# Patient Record
Sex: Male | Born: 1995 | Race: Black or African American | Hispanic: No | Marital: Single | Smoking: Current every day smoker
Health system: Southern US, Community
[De-identification: ages and names within clinical notes are randomized; demographics above are authoritative.]

## PROBLEM LIST (undated history)

## (undated) DIAGNOSIS — B20 Human immunodeficiency virus [HIV] disease: Secondary | ICD-10-CM

---

## 2017-05-21 ENCOUNTER — Emergency Department (HOSPITAL_COMMUNITY): Payer: Medicaid - Out of State

## 2017-05-21 ENCOUNTER — Emergency Department (HOSPITAL_COMMUNITY)
Admission: EM | Admit: 2017-05-21 | Discharge: 2017-05-21 | Disposition: A | Discharge status: Home / Self Care | Payer: Medicaid - Out of State | Attending: Emergency Medicine | Admitting: Emergency Medicine

## 2017-05-21 ENCOUNTER — Encounter (HOSPITAL_COMMUNITY): Payer: Self-pay

## 2017-05-21 DIAGNOSIS — Z21 Asymptomatic human immunodeficiency virus [HIV] infection status: Secondary | ICD-10-CM | POA: Diagnosis not present

## 2017-05-21 DIAGNOSIS — F17200 Nicotine dependence, unspecified, uncomplicated: Secondary | ICD-10-CM | POA: Insufficient documentation

## 2017-05-21 DIAGNOSIS — G43909 Migraine, unspecified, not intractable, without status migrainosus: Secondary | ICD-10-CM | POA: Diagnosis not present

## 2017-05-21 DIAGNOSIS — J069 Acute upper respiratory infection, unspecified: Secondary | ICD-10-CM

## 2017-05-21 DIAGNOSIS — G43009 Migraine without aura, not intractable, without status migrainosus: Secondary | ICD-10-CM

## 2017-05-21 HISTORY — DX: Human immunodeficiency virus (HIV) disease: B20

## 2017-05-21 LAB — BASIC METABOLIC PANEL
Anion gap: 7 (ref 5–15)
BUN: 13 mg/dL (ref 6–20)
CO2: 26 mmol/L (ref 22–32)
Calcium: 9.1 mg/dL (ref 8.9–10.3)
Chloride: 105 mmol/L (ref 101–111)
Creatinine, Ser: 0.91 mg/dL (ref 0.61–1.24)
GFR calc Af Amer: >60 mL/min (ref >60)
GFR calc non Af Amer: >60 mL/min (ref >60)
Glucose, Bld: 85 mg/dL (ref 65–99)
Potassium: 3.5 mmol/L (ref 3.5–5.1)
Sodium: 138 mmol/L (ref 135–145)

## 2017-05-21 LAB — CBC WITH DIFFERENTIAL/PLATELET
Basophils Absolute: 0 10*3/uL (ref 0.0–0.1)
Basophils Relative: 0 %
Eosinophils Absolute: 0.2 10*3/uL (ref 0.0–0.7)
Eosinophils Relative: 2 %
HCT: 41.6 % (ref 39.0–52.0)
Hemoglobin: 14.4 g/dL (ref 13.0–17.0)
Lymphocytes Relative: 47 %
Lymphs Abs: 3.6 10*3/uL (ref 0.7–4.0)
MCH: 30.3 pg (ref 26.0–34.0)
MCHC: 34.6 g/dL (ref 30.0–36.0)
MCV: 87.6 fL (ref 78.0–100.0)
Monocytes Absolute: 0.6 10*3/uL (ref 0.1–1.0)
Monocytes Relative: 8 %
Neutro Abs: 3.4 10*3/uL (ref 1.7–7.7)
Neutrophils Relative %: 43 %
Platelets: 307 10*3/uL (ref 150–400)
RBC: 4.75 MIL/uL (ref 4.22–5.81)
RDW: 12.1 % (ref 11.5–15.5)
WBC: 7.8 10*3/uL (ref 4.0–10.5)

## 2017-05-21 MED ORDER — GUAIFENESIN ER 1200 MG PO TB12: 1.0000 | ORAL_TABLET | Freq: Two times a day (BID) | ORAL | 0 refills | Status: AC

## 2017-05-21 MED ORDER — SODIUM CHLORIDE 0.9 % IV BOLUS (SEPSIS)
1000.0000 mL | Freq: Once | INTRAVENOUS | Status: AC
  Administered 2017-05-21: 1000 mL via INTRAVENOUS

## 2017-05-21 MED ORDER — KETOROLAC TROMETHAMINE 30 MG/ML IJ SOLN
30.0000 mg | Freq: Once | INTRAMUSCULAR | Status: AC
  Administered 2017-05-21: 30 mg via INTRAVENOUS
  Filled 2017-05-21: qty 1

## 2017-05-21 MED ORDER — DIPHENHYDRAMINE HCL 50 MG/ML IJ SOLN
25.0000 mg | Freq: Once | INTRAMUSCULAR | Status: AC
  Administered 2017-05-21: 25 mg via INTRAVENOUS
  Filled 2017-05-21: qty 1

## 2017-05-21 MED ORDER — PROMETHAZINE-DM 6.25-15 MG/5ML PO SYRP: 5.0000 mL | ORAL_SOLUTION | Freq: Four times a day (QID) | ORAL | 0 refills | Status: AC | PRN

## 2017-05-21 MED ORDER — PROCHLORPERAZINE EDISYLATE 5 MG/ML IJ SOLN
10.0000 mg | Freq: Once | INTRAMUSCULAR | Status: AC
  Administered 2017-05-21: 10 mg via INTRAVENOUS
  Filled 2017-05-21: qty 2

## 2017-05-21 NOTE — ED Notes (Signed)
Pt was sleeping in waiting area

## 2017-05-21 NOTE — ED Notes (Signed)
Pt observed leaving the waiting area and has not returned. Did not respond when called for a room x 2

## 2017-05-21 NOTE — ED Provider Notes (Signed)
[facility]-EMERGENCY DEPT Provider Note   CSN: [id number] Arrival date & time: 05/21/17  0151     History   Chief Complaint Chief Complaint  Patient presents with  . Migraine  . Back Pain    HPI Seth Tate is a 21 y.o. male.  HPI Patient presents to the emergency department with upper respiratory symptoms over the last week along with migraine headache since last night.  The patient states that he does have photophobia and phonophobia with nausea.  Patient states that his cold symptoms seem to be improving, but developed a headache.  The patient states that nothing seems make the condition better.  Patient states he did not take his medications at home without relief of his symptoms. The patient denies chest pain, shortness of breath, blurred vision, neck pain, fever,  weakness, numbness, dizziness, anorexia, edema, abdominal pain,  vomiting, diarrhea, rash, back pain, dysuria, hematemesis, bloody stool, near syncope, or syncope. Past Medical History:  Diagnosis Date  . HIV (human immunodeficiency virus infection) (HCC)     There are no active problems to display for this patient.   History reviewed. No pertinent surgical history.     Home Medications    Prior to Admission medications   Not on File    Family History No family history on file.  Social History Social History  Substance Use Topics  . Smoking status: Current Every Day Smoker  . Smokeless tobacco: Never Used  . Alcohol use Yes     Allergies   Patient has no known allergies.   Review of Systems Review of Systems  All other systems negative except as documented in the HPI. All pertinent positives and negatives as reviewed in the HPI. Physical Exam Updated Vital Signs BP 112/69   Pulse (!) 46   Temp 98.2 F (36.8 C) (Oral)   Resp 20   SpO2 97%   Physical Exam  Constitutional: He is oriented to person, place, and time. He appears well-developed and well-nourished. No distress.  HENT:  Head:  Normocephalic and atraumatic.  Mouth/Throat: Oropharynx is clear and moist.  Eyes: Pupils are equal, round, and reactive to light.  Neck: Normal range of motion. Neck supple.  Cardiovascular: Normal rate, regular rhythm and normal heart sounds.  Exam reveals no gallop and no friction rub.   No murmur heard. Pulmonary/Chest: Effort normal and breath sounds normal. No respiratory distress. He has no wheezes.  Abdominal: Soft. Bowel sounds are normal. He exhibits no distension. There is no tenderness.  Neurological: He is alert and oriented to person, place, and time. He exhibits normal muscle tone. Coordination normal.  Skin: Skin is warm and dry. Capillary refill takes less than 2 seconds. No rash noted. No erythema.  Psychiatric: He has a normal mood and affect. His behavior is normal.  Nursing note and vitals reviewed.    ED Treatments / Results  Labs (all labs ordered are listed, but only abnormal results are displayed) Labs Reviewed  CBC WITH DIFFERENTIAL/PLATELET  BASIC METABOLIC PANEL    EKG  EKG Interpretation None       Radiology Dg Chest 2 View  Result Date: 05/21/2017 CLINICAL DATA:  HIV positive.  Cough and headache. EXAM: CHEST  2 VIEW COMPARISON:  No prior . FINDINGS: Mediastinum and hilar structures normal. Very mild peribronchial cuffing noted. Mild bronchitis cannot be excluded . No pleural effusion or pneumothorax. Heart size normal. No acute bony abnormality. IMPRESSION: Very mild peribronchial cuffing noted. Mild bronchitis cannot be excluded Electronically Signed  ByMaisie Fus  Register   On: 05/21/2017 07:32    Procedures Procedures (including critical care time)  Medications Ordered in ED Medications  sodium chloride 0.9 % bolus 1,000 mL (1,000 mLs Intravenous New Bag/Given 05/21/17 0807)  ketorolac (TORADOL) 30 MG/ML injection 30 mg (30 mg Intravenous Given 05/21/17 0807)  prochlorperazine (COMPAZINE) injection 10 mg (10 mg Intravenous Given 05/21/17 0807)   diphenhydrAMINE (BENADRYL) injection 25 mg (25 mg Intravenous Given 05/21/17 0807)     Initial Impression / Assessment and Plan / ED Course  I have reviewed the triage vital signs and the nursing notes.  Pertinent labs & imaging results that were available during my care of the patient were reviewed by me and considered in my medical decision making (see chart for details).  The patient was treated for migraine headache, which he is feeling completely of his symptoms following IV medications, along with fluids.  Patient retreated for upper respiratory infection.  Told follow up with his primary doctor, told to return here as needed.  Patient agrees the plan and also questions were answered  Final Clinical Impressions(s) / ED Diagnoses   Final diagnoses:  None    New Prescriptions New Prescriptions   No medications on file     Charlestine Night, PA-C 05/26/17 1622    Tegeler, Canary Brim, MD 05/27/17 [phone]

## 2017-05-21 NOTE — ED Notes (Signed)
Patient to xray.

## 2017-05-21 NOTE — Discharge Instructions (Signed)
Return here as needed.  Increase her fluid intake, rest as much as possible.  Follow-up with your primary doctor

## 2017-05-21 NOTE — ED Triage Notes (Signed)
Pt states that he has been coughing up green mucous for the past several days, tonight he started having a migraine with photophobia and nausea along with lower back pain.
